# Patient Record
Sex: Female | Born: 2000
Health system: Southern US, Community
[De-identification: ages and names within clinical notes are randomized; demographics above are authoritative.]

## PROBLEM LIST (undated history)

## (undated) DIAGNOSIS — Z889 Allergy status to unspecified drugs, medicaments and biological substances status: Secondary | ICD-10-CM

---
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
## 2022-01-05 ENCOUNTER — Other Ambulatory Visit (HOSPITAL_COMMUNITY): Payer: Self-pay

## 2022-01-05 MED ORDER — VITAMIN D3 50 MCG (2000 UT) PO TABS: 1.0000 | ORAL_TABLET | Freq: Every day | ORAL | 6 refills | Status: AC

## 2022-01-05 MED ORDER — IRON 325 (65 FE) MG PO TABS: 325.0000 mg | ORAL_TABLET | Freq: Every day | ORAL | 6 refills | Status: AC

## 2022-11-11 ENCOUNTER — Other Ambulatory Visit (HOSPITAL_COMMUNITY): Payer: Self-pay

## 2022-11-11 MED ORDER — CETIRIZINE HCL 5 MG/5ML PO SOLN
5.0000 mg | Freq: Every evening | ORAL | 3 refills | Status: AC
  Filled 2022-11-11 – 2022-11-23 (×2): qty 60, 12d supply, fill #0

## 2022-11-11 MED ORDER — IRON 325 (65 FE) MG PO TABS
325.0000 mg | ORAL_TABLET | Freq: Every day | ORAL | 6 refills | Status: AC
  Filled 2022-11-11: qty 30, 30d supply, fill #0

## 2022-11-11 MED ORDER — TRETINOIN 0.025 % EX CREA
1.0000 | TOPICAL_CREAM | Freq: Two times a day (BID) | CUTANEOUS | 3 refills | Status: AC
  Filled 2022-11-11: qty 20, 20d supply, fill #0

## 2022-11-11 MED ORDER — FLUTICASONE PROPIONATE 50 MCG/ACT NA SUSP
2.0000 | Freq: Every day | NASAL | 6 refills | Status: AC
  Filled 2022-11-11: qty 16, 30d supply, fill #0

## 2022-11-12 ENCOUNTER — Other Ambulatory Visit (HOSPITAL_COMMUNITY): Payer: Self-pay

## 2022-11-23 ENCOUNTER — Other Ambulatory Visit (HOSPITAL_COMMUNITY): Payer: Self-pay
